# Patient Record
Sex: Male | Born: 2005 | Race: White | Hispanic: Yes | Marital: Single | Smoking: Never smoker
Health system: Southern US, Community
[De-identification: ages and names within clinical notes are randomized; demographics above are authoritative.]

## PROBLEM LIST (undated history)

## (undated) DIAGNOSIS — K5909 Other constipation: Secondary | ICD-10-CM

## (undated) DIAGNOSIS — R109 Unspecified abdominal pain: Secondary | ICD-10-CM

## (undated) DIAGNOSIS — I10 Essential (primary) hypertension: Secondary | ICD-10-CM

## (undated) DIAGNOSIS — R7303 Prediabetes: Secondary | ICD-10-CM

## (undated) HISTORY — PX: URINARY SURGERY: SHX2626

## (undated) HISTORY — DX: Other constipation: K59.09

## (undated) HISTORY — DX: Unspecified abdominal pain: R10.9

---
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
## 2023-01-06 DIAGNOSIS — Z713 Dietary counseling and surveillance: Secondary | ICD-10-CM | POA: Diagnosis not present

## 2023-01-06 DIAGNOSIS — Z1331 Encounter for screening for depression: Secondary | ICD-10-CM | POA: Diagnosis not present

## 2023-01-06 DIAGNOSIS — L83 Acanthosis nigricans: Secondary | ICD-10-CM | POA: Diagnosis not present

## 2023-01-06 DIAGNOSIS — Z7189 Other specified counseling: Secondary | ICD-10-CM | POA: Diagnosis not present

## 2023-01-06 DIAGNOSIS — Z0000 Encounter for general adult medical examination without abnormal findings: Secondary | ICD-10-CM | POA: Diagnosis not present

## 2023-01-06 DIAGNOSIS — Z68.41 Body mass index (BMI) pediatric, greater than or equal to 95th percentile for age: Secondary | ICD-10-CM | POA: Diagnosis not present

## 2023-02-16 ENCOUNTER — Encounter: Payer: Self-pay | Admitting: Podiatry

## 2023-02-16 ENCOUNTER — Ambulatory Visit (INDEPENDENT_AMBULATORY_CARE_PROVIDER_SITE_OTHER): Payer: Medicaid Other | Admitting: Podiatry

## 2023-02-16 DIAGNOSIS — L600 Ingrowing nail: Secondary | ICD-10-CM | POA: Diagnosis not present

## 2023-02-16 MED ORDER — AMOXICILLIN-POT CLAVULANATE 875-125 MG PO TABS: 1.0000 | ORAL_TABLET | Freq: Two times a day (BID) | ORAL | 0 refills | Status: AC

## 2023-02-16 NOTE — Progress Notes (Signed)
   Chief Complaint  Patient presents with   Ingrown Toenail    RM#6 Bilateral ingrown nail issues on big toes.    Subjective: Patient presents today for evaluation of pain to the lateral border bilateral great toes. Patient is concerned for possible ingrown nail.  It is very sensitive to touch.  Patient presents today for further treatment and evaluation.  Past Medical History:  Diagnosis Date   Abdominal pain, recurrent    Chronic constipation    Hypertension    Pre-diabetes     Past Surgical History:  Procedure Laterality Date   HARDWARE REMOVAL Left 10/23/2020   Procedure: HARDWARE REMOVAL;  Surgeon: Myrene Galas, MD;  Location: [facility] OR;  Service: Orthopedics;  Laterality: Left;   ORIF TOE FRACTURE Left 07/08/2020   Procedure: OPEN REDUCTION INTERNAL FIXATION (ORIF) FIFTH METATARSAL FRACTURE;  Surgeon: Myrene Galas, MD;  Location: [facility] OR;  Service: Orthopedics;  Laterality: Left;   URINARY SURGERY     as a 66 month old to correct how he urinates    No Known Allergies  Objective:  General: Well developed, nourished, in no acute distress, alert and oriented x3   Dermatology: Skin is warm, dry and supple bilateral.  Lateral border bilateral great toes is tender with evidence of an ingrowing nail. Pain on palpation noted to the border of the nail fold. The remaining nails appear unremarkable at this time.   Vascular: DP and PT pulses palpable.  No clinical evidence of vascular compromise  Neruologic: Grossly intact via light touch bilateral.  Musculoskeletal: No pedal deformity noted  Assesement: #1 Paronychia with ingrowing nail lateral border bilateral great toes #2 history of partial nail matricectomy years prior  Plan of Care:  -Patient evaluated.  -Discussed treatment alternatives and plan of care. Explained nail avulsion procedure and post procedure course to patient. -Patient opted for permanent partial nail avulsion of the ingrown portion of the nail.  -Prior to  procedure, local anesthesia infiltration utilized using 3 ml of a 50:50 mixture of 2% plain lidocaine and 0.5% plain marcaine in a normal hallux block fashion and a betadine prep performed.  -Partial permanent nail avulsion with chemical matrixectomy performed using 3x30sec applications of phenol followed by alcohol flush.  -Light dressing applied.  Post care instructions provided -Prescription for Augmentin 875/125 mg BID for 7 days -Return to clinic 3 weeks  Felecia Shelling, DPM [facility]  Dr. Felecia Shelling, DPM    [address] [place], [place] [address]                Office [phone]  Fax [phone]

## 2023-03-14 ENCOUNTER — Ambulatory Visit: Payer: Medicaid Other | Admitting: Podiatry

## 2023-03-16 ENCOUNTER — Ambulatory Visit (INDEPENDENT_AMBULATORY_CARE_PROVIDER_SITE_OTHER): Payer: Medicaid Other | Admitting: Podiatry

## 2023-03-16 ENCOUNTER — Encounter: Payer: Self-pay | Admitting: Podiatry

## 2023-03-16 DIAGNOSIS — L600 Ingrowing nail: Secondary | ICD-10-CM

## 2023-03-16 NOTE — Progress Notes (Signed)
   Chief Complaint  Patient presents with   Post-op Problem    Patient states everything has been ok, no pain nor discomfort , other then his left hallux has discoloration around the toe nail     Subjective: 18 y.o. male presents today status post permanent nail avulsion procedure of the lateral border of the bilateral great toes that was performed on 02/16/2023.   Past Medical History:  Diagnosis Date   Abdominal pain, recurrent    Chronic constipation    Hypertension    Pre-diabetes     Objective: Neurovascular status intact.  Skin is warm, dry and supple. Nail and respective nail fold appears to be healing appropriately.   Assessment: #1 s/p partial permanent nail matrixectomy lateral border bilateral great toes.  02/15/2021 for   Plan of care: #1 patient was evaluated  #2 light debridement of the periungual debris was performed to the border of the respective toe and nail plate using a tissue nipper. #3 patient is to return to clinic on a PRN basis.   Dot Gazella, DPM [facility]  Dr. Dot Gazella, DPM    [address] [place], [place] [address]                Office [phone]  Fax [phone]

## 2023-05-22 DIAGNOSIS — H5213 Myopia, bilateral: Secondary | ICD-10-CM | POA: Diagnosis not present

## 2023-07-12 ENCOUNTER — Emergency Department (HOSPITAL_COMMUNITY)
Admission: EM | Admit: 2023-07-12 | Discharge: 2023-07-12 | Disposition: A | Discharge status: Home / Self Care | Attending: Emergency Medicine | Admitting: Emergency Medicine

## 2023-07-12 ENCOUNTER — Emergency Department (HOSPITAL_COMMUNITY)

## 2023-07-12 ENCOUNTER — Encounter (HOSPITAL_COMMUNITY): Payer: Self-pay | Admitting: Pharmacy Technician

## 2023-07-12 ENCOUNTER — Other Ambulatory Visit: Payer: Self-pay

## 2023-07-12 DIAGNOSIS — Y92410 Unspecified street and highway as the place of occurrence of the external cause: Secondary | ICD-10-CM | POA: Insufficient documentation

## 2023-07-12 DIAGNOSIS — M25511 Pain in right shoulder: Secondary | ICD-10-CM | POA: Diagnosis not present

## 2023-07-12 NOTE — Discharge Instructions (Addendum)
 Return for any problem.   Take Tylenol  and ibuprofen  for pain.  Apply ice as instructed.  X-rays obtained today are without fracture or other acute problems identified.
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
## 2023-08-29 ENCOUNTER — Other Ambulatory Visit: Payer: Self-pay | Admitting: Orthopedic Surgery

## 2023-08-29 DIAGNOSIS — G5782 Other specified mononeuropathies of left lower limb: Secondary | ICD-10-CM | POA: Diagnosis not present

## 2023-08-29 DIAGNOSIS — R2242 Localized swelling, mass and lump, left lower limb: Secondary | ICD-10-CM | POA: Diagnosis not present

## 2023-08-29 DIAGNOSIS — R600 Localized edema: Secondary | ICD-10-CM

## 2023-08-29 DIAGNOSIS — M79672 Pain in left foot: Secondary | ICD-10-CM | POA: Diagnosis not present

## 2023-08-30 ENCOUNTER — Inpatient Hospital Stay
Admission: RE | Admit: 2023-08-30 | Discharge: 2023-08-30 | Discharge status: Home / Self Care | Source: Ambulatory Visit | Attending: Orthopedic Surgery | Admitting: Orthopedic Surgery

## 2023-08-30 DIAGNOSIS — R600 Localized edema: Secondary | ICD-10-CM

## 2023-09-01 ENCOUNTER — Other Ambulatory Visit

## 2023-11-07 DIAGNOSIS — R42 Dizziness and giddiness: Secondary | ICD-10-CM | POA: Diagnosis not present

## 2023-11-07 DIAGNOSIS — R201 Hypoesthesia of skin: Secondary | ICD-10-CM | POA: Diagnosis not present

## 2023-11-07 DIAGNOSIS — G4700 Insomnia, unspecified: Secondary | ICD-10-CM | POA: Diagnosis not present

## 2023-12-08 ENCOUNTER — Ambulatory Visit: Payer: Self-pay

## 2023-12-08 NOTE — Telephone Encounter (Signed)
 FYI Only or Action Required?: FYI only for provider.  Patient was last seen in primary care on n/a.  Called Nurse Triage reporting Headache.  Symptoms began several days ago.  Interventions attempted: OTC medications: tylenol .  Symptoms are: unchanged.  Triage Disposition: See Physician Within 24 Hours  Patient/caregiver understands and will follow disposition?: Yes   Due to availability, RN did advise pt to go to UC to be checked out as well since this headache has been lingering and OTCs are not helping.     Copied from CRM [id number]. Topic: Clinical - Red Word Triage >> Dec 08, 2023 10:05 AM [place] MATSU wrote: Red Word that prompted transfer to Nurse Triage: left side pain / throbbing of face  for 3-4 days now. Reason for Disposition  [1] MODERATE headache (e.g., interferes with normal activities) AND [2] present > 24 hours AND [3] unexplained  (Exceptions: Pain medicines not tried, typical migraine, or headache part of viral illness.)  Answer Assessment - Initial Assessment Questions 1. LOCATION: Where does it hurt?      Left side of face - hurts more on the top of jaw to top of jaw, eye 2. ONSET: When did the headache start? (e.g., minutes, hours, days)      3-4 days ago 3. PATTERN: Does the pain come and go, or has it been constant since it started?     Sleeping a lot, pain lighter when he first wakes  4. SEVERITY: How bad is the pain? and What does it keep you from doing?  (e.g., Scale 1-10; mild, moderate, or severe)     6/10 5. RECURRENT SYMPTOM: Have you ever had headaches before? If Yes, ask: When was the last time? and What happened that time?      no 6. CAUSE: What do you think is causing the headache?     no 7. MIGRAINE: Have you been diagnosed with migraine headaches? If Yes, ask: Is this headache similar?      no 8. HEAD INJURY: Has there been any recent injury to your head?      no 9. OTHER SYMPTOMS: Do you have any other symptoms?  (e.g., fever, stiff neck, eye pain, sore throat, cold symptoms)     At first, day pt had lots of body aches, all other symptoms have resolved.  Protocols used: [facility]

## 2023-12-23 ENCOUNTER — Ambulatory Visit: Payer: Self-pay | Admitting: Student in an Organized Health Care Education/Training Program

## 2024-01-10 ENCOUNTER — Ambulatory Visit: Payer: Self-pay | Admitting: Student in an Organized Health Care Education/Training Program

## 2024-01-20 ENCOUNTER — Ambulatory Visit: Payer: Self-pay

## 2024-01-30 ENCOUNTER — Ambulatory Visit: Payer: Self-pay | Admitting: Student in an Organized Health Care Education/Training Program
